# Patient Record
Sex: Female | Born: 1980 | Race: White | Hispanic: No | Marital: Married | Smoking: Never smoker
Health system: Southern US, Community
[De-identification: ages and names within clinical notes are randomized; demographics above are authoritative.]

## PROBLEM LIST (undated history)

## (undated) DIAGNOSIS — E039 Hypothyroidism, unspecified: Secondary | ICD-10-CM

## (undated) HISTORY — PX: TONSILLECTOMY: SUR1361

## (undated) HISTORY — PX: DILATION AND CURETTAGE OF UTERUS: SHX78

## (undated) HISTORY — PX: BREAST SURGERY: SHX581

## (undated) HISTORY — PX: ECTOPIC PREGNANCY SURGERY: SHX613

---

## 2010-08-13 ENCOUNTER — Encounter: Payer: Self-pay | Admitting: Emergency Medicine

## 2010-08-14 ENCOUNTER — Encounter (INDEPENDENT_AMBULATORY_CARE_PROVIDER_SITE_OTHER): Payer: Self-pay | Admitting: Obstetrics and Gynecology

## 2010-08-14 ENCOUNTER — Ambulatory Visit (HOSPITAL_COMMUNITY): Admission: AD | Admit: 2010-08-14 | Discharge: 2010-08-14 | Payer: Self-pay | Admitting: Obstetrics and Gynecology

## 2011-03-12 LAB — ABO/RH: ABO/RH(D): O POS

## 2011-03-12 LAB — HEMOGLOBIN AND HEMATOCRIT, BLOOD
HCT: 37.6 % (ref 36.0–46.0)
Hemoglobin: 12.8 g/dL (ref 12.0–15.0)

## 2011-03-12 LAB — CBC
MCHC: 33.6 g/dL (ref 30.0–36.0)
WBC: 10.6 10*3/uL — ABNORMAL HIGH (ref 4.0–10.5)

## 2011-03-12 LAB — HCG, QUANTITATIVE, PREGNANCY: hCG, Beta Chain, Quant, S: 1165 m[IU]/mL — ABNORMAL HIGH (ref <5)

## 2011-05-26 ENCOUNTER — Ambulatory Visit (HOSPITAL_COMMUNITY)
Admission: AD | Admit: 2011-05-26 | Discharge: 2011-05-26 | Disposition: A | Discharge status: Home / Self Care | Payer: Managed Care, Other (non HMO) | Source: Ambulatory Visit | Attending: Obstetrics and Gynecology | Admitting: Obstetrics and Gynecology

## 2011-05-26 ENCOUNTER — Ambulatory Visit (HOSPITAL_COMMUNITY): Payer: Managed Care, Other (non HMO)

## 2011-05-26 ENCOUNTER — Other Ambulatory Visit: Payer: Self-pay | Admitting: Obstetrics and Gynecology

## 2011-05-26 DIAGNOSIS — O021 Missed abortion: Secondary | ICD-10-CM | POA: Insufficient documentation

## 2011-05-26 LAB — COMPREHENSIVE METABOLIC PANEL
ALT: 12 U/L (ref 0–35)
AST: 17 U/L (ref 0–37)
Albumin: 4.6 g/dL (ref 3.5–5.2)
Calcium: 10 mg/dL (ref 8.4–10.5)
Creatinine, Ser: 0.61 mg/dL (ref 0.4–1.2)
GFR calc Af Amer: >60 mL/min (ref >60)
Sodium: 138 mEq/L (ref 135–145)
Total Protein: 7.9 g/dL (ref 6.0–8.3)

## 2011-05-26 LAB — CBC
HCT: 41 % (ref 36.0–46.0)
Hemoglobin: 14.3 g/dL (ref 12.0–15.0)
MCV: 87.4 fL (ref 78.0–100.0)
RDW: 13 % (ref 11.5–15.5)
WBC: 7.6 10*3/uL (ref 4.0–10.5)

## 2011-05-26 LAB — CREATININE, SERUM
Creatinine, Ser: 0.52 mg/dL (ref 0.4–1.2)
GFR calc Af Amer: >60 mL/min (ref >60)
GFR calc non Af Amer: >60 mL/min (ref >60)

## 2011-05-26 LAB — T3, FREE: T3, Free: 2.9 pg/mL (ref 2.3–4.2)

## 2011-05-26 LAB — DIFFERENTIAL
Basophils Relative: 0 % (ref 0–1)
Lymphocytes Relative: 21 % (ref 12–46)
Lymphs Abs: 1.6 10*3/uL (ref 0.7–4.0)
Monocytes Absolute: 0.5 10*3/uL (ref 0.1–1.0)
Monocytes Relative: 7 % (ref 3–12)
Neutro Abs: 5.4 10*3/uL (ref 1.7–7.7)

## 2011-05-29 ENCOUNTER — Inpatient Hospital Stay (HOSPITAL_COMMUNITY)
Admission: AD | Admit: 2011-05-29 | Discharge: 2011-05-29 | Disposition: A | Discharge status: Home / Self Care | Payer: Managed Care, Other (non HMO) | Source: Ambulatory Visit | Attending: Obstetrics and Gynecology | Admitting: Obstetrics and Gynecology

## 2011-05-29 DIAGNOSIS — O039 Complete or unspecified spontaneous abortion without complication: Secondary | ICD-10-CM

## 2011-05-29 LAB — HCG, QUANTITATIVE, PREGNANCY: hCG, Beta Chain, Quant, S: 8615 m[IU]/mL — ABNORMAL HIGH (ref <5)

## 2011-06-01 ENCOUNTER — Inpatient Hospital Stay (HOSPITAL_COMMUNITY)
Admission: AD | Admit: 2011-06-01 | Discharge: 2011-06-01 | Disposition: A | Discharge status: Home / Self Care | Payer: Managed Care, Other (non HMO) | Source: Ambulatory Visit | Attending: Obstetrics & Gynecology | Admitting: Obstetrics & Gynecology

## 2011-06-01 DIAGNOSIS — O00109 Unspecified tubal pregnancy without intrauterine pregnancy: Secondary | ICD-10-CM | POA: Insufficient documentation

## 2011-06-01 LAB — HCG, QUANTITATIVE, PREGNANCY: hCG, Beta Chain, Quant, S: 2846 m[IU]/mL — ABNORMAL HIGH (ref <5)

## 2011-06-15 ENCOUNTER — Inpatient Hospital Stay (HOSPITAL_COMMUNITY)
Admission: AD | Admit: 2011-06-15 | Discharge: 2011-06-15 | Disposition: A | Discharge status: Home / Self Care | Payer: Managed Care, Other (non HMO) | Source: Ambulatory Visit | Attending: Obstetrics and Gynecology | Admitting: Obstetrics and Gynecology

## 2011-06-15 DIAGNOSIS — O039 Complete or unspecified spontaneous abortion without complication: Secondary | ICD-10-CM | POA: Insufficient documentation

## 2011-06-15 DIAGNOSIS — E039 Hypothyroidism, unspecified: Secondary | ICD-10-CM | POA: Insufficient documentation

## 2011-06-15 DIAGNOSIS — E079 Disorder of thyroid, unspecified: Secondary | ICD-10-CM | POA: Insufficient documentation

## 2011-07-09 NOTE — Op Note (Signed)
  NAMEMERDIS, SNODGRASS NO.:  [id number]  MEDICAL RECORD NO.:  [id number]           PATIENT TYPE:  O  LOCATION:  [facility]                          [facility]  PHYSICIAN:  Carrington Clamp, M.D. DATE OF BIRTH:  September 05, 1981  DATE OF PROCEDURE:  05/26/2011 DATE OF DISCHARGE:                              OPERATIVE REPORT   PREOPERATIVE DIAGNOSIS:  Missed abortion.  POSTOPERATIVE DIAGNOSIS:  Missed abortion.  PROCEDURES:  Dilation and evacuation.  SURGEON:  Carrington Clamp, MD  ASSISTANTS:  None.  ANESTHESIA:  General.  FINDINGS:  About a 9-week size uterus down to 6-week good cri.  There was apparently tissue that was in the suction tubing, however, this had been sent for frozen section to make sure the patient does have products of conception in the uterus.  She does have a history of ectopic and her HCGs have been rising consistently.  SPECIMENS:  Uterine contents for pathology and frozen section.ESTIMATED BLOOD LOSS:  200 mL.  IV FLUIDS:  URINE OUTPUT:  Not measured.  COMPLICATIONS:  None.  Counts were correct x3.  MEDICATIONS:  Methergine IM.  TECHNIQUE:  After adequate general anesthesia was achieved, the patient was prepped and draped in usual sterile fashion in dorsal lithotomy position.  Bladder was emptied with a red rubber catheter.  A speculum was placed in vagina.  Single-tooth tenaculum was used to stabilize the cervix.  The cervix was dilated up with Shawnie Pons dilators.  The 9-mm suction curette was passed into the uterine cavity and although there was some brisk bleeding.  The suction curettage was performed until the contents were removed.  Good cri was noted with sharp curettage, bleeding actually slowed after that and the patient was given a dose of Methergine.  All instruments were withdrawn from the vagina and the patient returned to recovery room in stable condition.     Carrington Clamp, M.D.     MH/MEDQ  D:  05/26/2011   T:  05/27/2011  Job:  [id number]  Electronically Signed by Carrington Clamp MD on 07/09/2011 06:14:20 PM

## 2012-06-09 LAB — OB RESULTS CONSOLE HEPATITIS B SURFACE ANTIGEN: Hepatitis B Surface Ag: NEGATIVE

## 2012-06-09 LAB — OB RESULTS CONSOLE ABO/RH: RH Type: POSITIVE

## 2012-06-09 LAB — OB RESULTS CONSOLE RPR: RPR: NONREACTIVE

## 2012-06-09 LAB — OB RESULTS CONSOLE ANTIBODY SCREEN: Antibody Screen: NEGATIVE

## 2012-11-17 ENCOUNTER — Encounter (HOSPITAL_COMMUNITY): Payer: Self-pay

## 2012-11-17 ENCOUNTER — Inpatient Hospital Stay (HOSPITAL_COMMUNITY)
Admission: AD | Admit: 2012-11-17 | Discharge: 2012-11-17 | Disposition: A | Discharge status: Home / Self Care | Payer: BC Managed Care – PPO | Source: Ambulatory Visit | Attending: Obstetrics and Gynecology | Admitting: Obstetrics and Gynecology

## 2012-11-17 ENCOUNTER — Inpatient Hospital Stay (HOSPITAL_COMMUNITY): Payer: BC Managed Care – PPO

## 2012-11-17 DIAGNOSIS — O4690 Antepartum hemorrhage, unspecified, unspecified trimester: Secondary | ICD-10-CM

## 2012-11-17 DIAGNOSIS — O4410 Placenta previa with hemorrhage, unspecified trimester: Secondary | ICD-10-CM | POA: Insufficient documentation

## 2012-11-17 DIAGNOSIS — O4400 Placenta previa specified as without hemorrhage, unspecified trimester: Secondary | ICD-10-CM

## 2012-11-17 HISTORY — DX: Hypothyroidism, unspecified: E03.9

## 2012-11-17 MED ORDER — NIFEDIPINE 10 MG PO CAPS
20.0000 mg | ORAL_CAPSULE | Freq: Once | ORAL | Status: AC
  Administered 2012-11-17: 20 mg via ORAL
  Filled 2012-11-17: qty 2

## 2012-11-17 MED ORDER — NIFEDIPINE 10 MG PO CAPS: 10.0000 mg | ORAL_CAPSULE | Freq: Three times a day (TID) | ORAL | Status: DC | PRN

## 2012-11-17 NOTE — MAU Provider Note (Signed)
Chief Complaint:  Vaginal Bleeding   First Provider Initiated Contact with Patient 11/17/12 1941      HPI: Barby Colvard is a 31 y.o. G3P0020 at [redacted]w[redacted]d who presents to maternity admissions reporting first episode of bright red bleeding with known placenta previa. States she stood up at work and felt blood running down her leg, then wiped and saw red blood. She immediately drove here (2 hours distance). Had a couple of episodes of light spotting, last at 24 weeks. O pos. Denies contractions, leakage of fluid or vaginal bleeding. Good fetal movement.   Pregnancy Course:   Past Medical History: Past Medical History  Diagnosis Date  . Hypothyroidism     Past obstetric history: OB History    Grav Para Term Preterm Abortions TAB SAB Ect Mult Living   3    2  1 1        # Outc Date GA Lbr Len/2nd Wgt Sex Del Anes PTL Lv   1 SAB            2 ECT            3 CUR               Past Surgical History: Past Surgical History  Procedure Date  . Dilation and curettage of uterus   . Ectopic pregnancy surgery   . Tonsillectomy   . Breast surgery     Family History: History reviewed. No pertinent family history.  Social History: History  Substance Use Topics  . Smoking status: Never Smoker   . Smokeless tobacco: Not on file  . Alcohol Use: No    Allergies: No Known Allergies  Meds:  Prescriptions prior to admission  Medication Sig Dispense Refill  . lansoprazole (PREVACID) 15 MG capsule Take 15 mg by mouth daily.      Marland Kitchen levothyroxine (SYNTHROID, LEVOTHROID) 100 MCG tablet Take 100 mcg by mouth daily.      . Prenatal Vit-Fe Fumarate-FA (MULTIVITAMIN-PRENATAL) 27-0.8 MG TABS Take 1 tablet by mouth daily.        ROS: Pertinent findings in history of present illness.  Physical Exam  Blood pressure 127/72, pulse 101, temperature 98.2 F (36.8 C), temperature source Oral, resp. rate 16, height 5\' 11"  (1.803 m), weight 89.075 kg (196 lb 6 oz). GENERAL: Well-developed,  well-nourished female in no acute distress.  HEENT: normocephalic HEART: normal rate RESP: normal effort ABDOMEN: Soft, non-tender, gravid appropriate for gestational age EXTREMITIES: Nontender, no edema NEURO: alert and oriented Perineum: no blood, no peripad   FHT:  Baseline 135-140 , moderate variability, accelerations present, no decelerations Contractions: q 3-4 mins, 30 sec duration   Labs: No results found for this or any previous visit (from the past 24 hour(s)).  Imaging:  No results found.  MAU Course: 1955: C/W Dr. Henderson Cloud: will do speculum exam to evaluate for bleeding, [place] to check placenta, AFI, BPP  Assessment: No diagnosis found.  Plan:    Medication List     As of 11/17/2012  7:42 PM    ASK your doctor about these medications         lansoprazole 15 MG capsule   Commonly known as: PREVACID   Take 15 mg by mouth daily.      levothyroxine 100 MCG tablet   Commonly known as: SYNTHROID, LEVOTHROID   Take 100 mcg by mouth daily.      multivitamin-prenatal 27-0.8 MG Tabs   Take 1 tablet by mouth daily.  Danae Orleans, CNM 11/17/2012 7:42 PM  Care assumed by Wynelle Bourgeois, CNM at 2011  Spec Exam:  Small to mod. Brown/red mucous in vault with closed cervix  [place]:  BPP 8/8         Post Placenta Previa         No evidence of abruption        AFI normal  Discussed with Dr Henderson Cloud.  Will d/c home with PRN procardia Pelvic rest Advised out of work through weekend. Bleeding precautions

## 2012-11-17 NOTE — MAU Note (Signed)
Pt return from US  

## 2012-11-17 NOTE — MAU Note (Signed)
Pt states has placenta previa, has noted bleeding x3 hours, intermittent tightening in lower abdomen only.

## 2012-12-01 ENCOUNTER — Other Ambulatory Visit: Payer: Self-pay | Admitting: Obstetrics and Gynecology

## 2012-12-01 ENCOUNTER — Encounter (HOSPITAL_COMMUNITY): Payer: Self-pay | Admitting: *Deleted

## 2012-12-02 ENCOUNTER — Inpatient Hospital Stay (HOSPITAL_COMMUNITY)
Admission: RE | Admit: 2012-12-02 | Payer: BC Managed Care – PPO | Source: Ambulatory Visit | Admitting: Obstetrics and Gynecology

## 2012-12-02 ENCOUNTER — Encounter (HOSPITAL_COMMUNITY)
Admission: AD | Disposition: A | Discharge status: Home / Self Care | Payer: Self-pay | Source: Ambulatory Visit | Attending: Obstetrics and Gynecology

## 2012-12-02 ENCOUNTER — Encounter (HOSPITAL_COMMUNITY): Payer: Self-pay | Admitting: *Deleted

## 2012-12-02 ENCOUNTER — Encounter (HOSPITAL_COMMUNITY): Payer: Self-pay | Admitting: Anesthesiology

## 2012-12-02 ENCOUNTER — Inpatient Hospital Stay (HOSPITAL_COMMUNITY)
Admission: AD | Admit: 2012-12-02 | Discharge: 2012-12-05 | DRG: 651 | Disposition: A | Discharge status: Home / Self Care | Payer: BC Managed Care – PPO | Source: Ambulatory Visit | Attending: Obstetrics and Gynecology | Admitting: Obstetrics and Gynecology

## 2012-12-02 ENCOUNTER — Inpatient Hospital Stay (HOSPITAL_COMMUNITY): Payer: BC Managed Care – PPO | Admitting: Anesthesiology

## 2012-12-02 DIAGNOSIS — O4400 Placenta previa specified as without hemorrhage, unspecified trimester: Secondary | ICD-10-CM

## 2012-12-02 DIAGNOSIS — E039 Hypothyroidism, unspecified: Secondary | ICD-10-CM | POA: Diagnosis present

## 2012-12-02 DIAGNOSIS — E079 Disorder of thyroid, unspecified: Secondary | ICD-10-CM | POA: Diagnosis present

## 2012-12-02 DIAGNOSIS — O4410 Placenta previa with hemorrhage, unspecified trimester: Principal | ICD-10-CM | POA: Diagnosis present

## 2012-12-02 LAB — CBC
HCT: 36.9 % (ref 36.0–46.0)
Hemoglobin: 12.5 g/dL (ref 12.0–15.0)
MCH: 30.5 pg (ref 26.0–34.0)
MCV: 90 fL (ref 78.0–100.0)
Platelets: 182 10*3/uL (ref 150–400)
RBC: 4.1 MIL/uL (ref 3.87–5.11)
WBC: 9.2 10*3/uL (ref 4.0–10.5)

## 2012-12-02 LAB — PREPARE RBC (CROSSMATCH)

## 2012-12-02 SURGERY — Surgical Case
Anesthesia: Spinal

## 2012-12-02 SURGERY — Surgical Case
Anesthesia: Regional

## 2012-12-02 MED ORDER — PHENYLEPHRINE HCL 10 MG/ML IJ SOLN
INTRAMUSCULAR | Status: DC | PRN
  Administered 2012-12-02 (×8): 80 ug via INTRAVENOUS

## 2012-12-02 MED ORDER — ONDANSETRON HCL 4 MG PO TABS: 4.0000 mg | ORAL_TABLET | ORAL | Status: DC | PRN

## 2012-12-02 MED ORDER — KETOROLAC TROMETHAMINE 30 MG/ML IJ SOLN
30.0000 mg | Freq: Four times a day (QID) | INTRAMUSCULAR | Status: AC | PRN
  Administered 2012-12-02: 30 mg via INTRAVENOUS

## 2012-12-02 MED ORDER — ZOLPIDEM TARTRATE 5 MG PO TABS: 5.0000 mg | ORAL_TABLET | Freq: Every evening | ORAL | Status: DC | PRN

## 2012-12-02 MED ORDER — OXYTOCIN 10 UNIT/ML IJ SOLN
INTRAMUSCULAR | Status: AC
  Filled 2012-12-02: qty 4

## 2012-12-02 MED ORDER — LACTATED RINGERS IV SOLN
INTRAVENOUS | Status: DC
  Administered 2012-12-02 (×3): via INTRAVENOUS

## 2012-12-02 MED ORDER — FENTANYL CITRATE 0.05 MG/ML IJ SOLN: 25.0000 ug | INTRAMUSCULAR | Status: DC | PRN

## 2012-12-02 MED ORDER — WITCH HAZEL-GLYCERIN EX PADS: 1.0000 "application " | MEDICATED_PAD | CUTANEOUS | Status: DC | PRN

## 2012-12-02 MED ORDER — FENTANYL CITRATE 0.05 MG/ML IJ SOLN
INTRAMUSCULAR | Status: AC
  Filled 2012-12-02: qty 2

## 2012-12-02 MED ORDER — SCOPOLAMINE 1 MG/3DAYS TD PT72
1.0000 | MEDICATED_PATCH | Freq: Once | TRANSDERMAL | Status: AC
  Administered 2012-12-02: 1.5 mg via TRANSDERMAL

## 2012-12-02 MED ORDER — CITRIC ACID-SODIUM CITRATE 334-500 MG/5ML PO SOLN
30.0000 mL | Freq: Once | ORAL | Status: AC
  Administered 2012-12-02: 30 mL via ORAL
  Filled 2012-12-02: qty 15

## 2012-12-02 MED ORDER — MENTHOL 3 MG MT LOZG: 1.0000 | LOZENGE | OROMUCOSAL | Status: DC | PRN

## 2012-12-02 MED ORDER — CEFAZOLIN SODIUM-DEXTROSE 2-3 GM-% IV SOLR
2.0000 g | INTRAVENOUS | Status: AC
  Administered 2012-12-02: 2 g via INTRAVENOUS
  Filled 2012-12-02: qty 50

## 2012-12-02 MED ORDER — OXYTOCIN 40 UNITS IN LACTATED RINGERS INFUSION - SIMPLE MED
62.5000 mL/h | INTRAVENOUS | Status: AC
  Administered 2012-12-02: 62.5 mL/h via INTRAVENOUS
  Filled 2012-12-02: qty 1000

## 2012-12-02 MED ORDER — LANOLIN HYDROUS EX OINT: 1.0000 "application " | TOPICAL_OINTMENT | CUTANEOUS | Status: DC | PRN

## 2012-12-02 MED ORDER — MORPHINE SULFATE (PF) 0.5 MG/ML IJ SOLN
INTRAMUSCULAR | Status: DC | PRN
  Administered 2012-12-02: .15 mg via INTRATHECAL

## 2012-12-02 MED ORDER — BUPIVACAINE IN DEXTROSE 0.75-8.25 % IT SOLN
INTRATHECAL | Status: DC | PRN
  Administered 2012-12-02: 2 mL via INTRATHECAL

## 2012-12-02 MED ORDER — DIBUCAINE 1 % RE OINT: 1.0000 "application " | TOPICAL_OINTMENT | RECTAL | Status: DC | PRN

## 2012-12-02 MED ORDER — DIPHENHYDRAMINE HCL 25 MG PO CAPS: 25.0000 mg | ORAL_CAPSULE | Freq: Four times a day (QID) | ORAL | Status: DC | PRN

## 2012-12-02 MED ORDER — ONDANSETRON HCL 4 MG/2ML IJ SOLN
INTRAMUSCULAR | Status: DC | PRN
  Administered 2012-12-02: 4 mg via INTRAVENOUS

## 2012-12-02 MED ORDER — DIPHENHYDRAMINE HCL 50 MG/ML IJ SOLN: 25.0000 mg | INTRAMUSCULAR | Status: DC | PRN

## 2012-12-02 MED ORDER — TETANUS-DIPHTH-ACELL PERTUSSIS 5-2.5-18.5 LF-MCG/0.5 IM SUSP
0.5000 mL | Freq: Once | INTRAMUSCULAR | Status: AC
  Administered 2012-12-04: 0.5 mL via INTRAMUSCULAR
  Filled 2012-12-02: qty 0.5

## 2012-12-02 MED ORDER — SIMETHICONE 80 MG PO CHEW
80.0000 mg | CHEWABLE_TABLET | Freq: Three times a day (TID) | ORAL | Status: DC
  Administered 2012-12-02 – 2012-12-05 (×7): 80 mg via ORAL

## 2012-12-02 MED ORDER — NALOXONE HCL 0.4 MG/ML IJ SOLN: 0.4000 mg | INTRAMUSCULAR | Status: DC | PRN

## 2012-12-02 MED ORDER — SODIUM CHLORIDE 0.9 % IJ SOLN: 3.0000 mL | INTRAMUSCULAR | Status: DC | PRN

## 2012-12-02 MED ORDER — IBUPROFEN 600 MG PO TABS
600.0000 mg | ORAL_TABLET | Freq: Four times a day (QID) | ORAL | Status: DC | PRN
  Filled 2012-12-02 (×9): qty 1

## 2012-12-02 MED ORDER — LACTATED RINGERS IV SOLN
INTRAVENOUS | Status: DC
  Administered 2012-12-02: 125 mL/h via INTRAVENOUS
  Administered 2012-12-03: 06:00:00 via INTRAVENOUS

## 2012-12-02 MED ORDER — FENTANYL CITRATE 0.05 MG/ML IJ SOLN
INTRAMUSCULAR | Status: DC | PRN
  Administered 2012-12-02: 25 ug via INTRATHECAL

## 2012-12-02 MED ORDER — DIPHENHYDRAMINE HCL 25 MG PO CAPS: 25.0000 mg | ORAL_CAPSULE | ORAL | Status: DC | PRN

## 2012-12-02 MED ORDER — MORPHINE SULFATE 0.5 MG/ML IJ SOLN
INTRAMUSCULAR | Status: AC
  Filled 2012-12-02: qty 10

## 2012-12-02 MED ORDER — PRENATAL MULTIVITAMIN CH
1.0000 | ORAL_TABLET | Freq: Every day | ORAL | Status: DC
  Administered 2012-12-03 – 2012-12-05 (×3): 1 via ORAL
  Filled 2012-12-02 (×3): qty 1

## 2012-12-02 MED ORDER — NALBUPHINE HCL 10 MG/ML IJ SOLN
5.0000 mg | INTRAMUSCULAR | Status: DC | PRN
  Filled 2012-12-02: qty 1

## 2012-12-02 MED ORDER — DIPHENHYDRAMINE HCL 50 MG/ML IJ SOLN
12.5000 mg | INTRAMUSCULAR | Status: DC | PRN
  Administered 2012-12-02: 12.5 mg via INTRAVENOUS
  Filled 2012-12-02: qty 1

## 2012-12-02 MED ORDER — PHENYLEPHRINE 40 MCG/ML (10ML) SYRINGE FOR IV PUSH (FOR BLOOD PRESSURE SUPPORT)
PREFILLED_SYRINGE | INTRAVENOUS | Status: AC
  Filled 2012-12-02: qty 20

## 2012-12-02 MED ORDER — KETOROLAC TROMETHAMINE 30 MG/ML IJ SOLN: 30.0000 mg | Freq: Four times a day (QID) | INTRAMUSCULAR | Status: AC | PRN

## 2012-12-02 MED ORDER — MEPERIDINE HCL 25 MG/ML IJ SOLN: 6.2500 mg | INTRAMUSCULAR | Status: DC | PRN

## 2012-12-02 MED ORDER — SIMETHICONE 80 MG PO CHEW: 80.0000 mg | CHEWABLE_TABLET | ORAL | Status: DC | PRN

## 2012-12-02 MED ORDER — ONDANSETRON HCL 4 MG/2ML IJ SOLN: 4.0000 mg | INTRAMUSCULAR | Status: DC | PRN

## 2012-12-02 MED ORDER — METOCLOPRAMIDE HCL 5 MG/ML IJ SOLN: 10.0000 mg | Freq: Three times a day (TID) | INTRAMUSCULAR | Status: DC | PRN

## 2012-12-02 MED ORDER — LEVOTHYROXINE SODIUM 100 MCG PO TABS
100.0000 ug | ORAL_TABLET | Freq: Every day | ORAL | Status: DC
  Administered 2012-12-03 – 2012-12-05 (×3): 100 ug via ORAL
  Filled 2012-12-02 (×3): qty 1

## 2012-12-02 MED ORDER — NALOXONE HCL 1 MG/ML IJ SOLN: 1.0000 ug/kg/h | INTRAVENOUS | Status: DC | PRN

## 2012-12-02 MED ORDER — OXYCODONE-ACETAMINOPHEN 5-325 MG PO TABS
1.0000 | ORAL_TABLET | ORAL | Status: DC | PRN
  Administered 2012-12-03: 1 via ORAL
  Administered 2012-12-03: 2 via ORAL
  Administered 2012-12-03: 1 via ORAL
  Administered 2012-12-03 – 2012-12-05 (×7): 2 via ORAL
  Filled 2012-12-02 (×5): qty 2
  Filled 2012-12-02 (×3): qty 1
  Filled 2012-12-02 (×3): qty 2
  Filled 2012-12-02: qty 1

## 2012-12-02 MED ORDER — METHYLERGONOVINE MALEATE 0.2 MG/ML IJ SOLN: 0.2000 mg | INTRAMUSCULAR | Status: DC | PRN

## 2012-12-02 MED ORDER — OXYTOCIN 10 UNIT/ML IJ SOLN
40.0000 [IU] | INTRAVENOUS | Status: DC | PRN
  Administered 2012-12-02: 40 [IU] via INTRAVENOUS

## 2012-12-02 MED ORDER — SENNOSIDES-DOCUSATE SODIUM 8.6-50 MG PO TABS
2.0000 | ORAL_TABLET | Freq: Every day | ORAL | Status: DC
  Administered 2012-12-02 – 2012-12-04 (×3): 2 via ORAL

## 2012-12-02 MED ORDER — ONDANSETRON HCL 4 MG/2ML IJ SOLN
INTRAMUSCULAR | Status: AC
  Filled 2012-12-02: qty 2

## 2012-12-02 MED ORDER — ONDANSETRON HCL 4 MG/2ML IJ SOLN: 4.0000 mg | Freq: Three times a day (TID) | INTRAMUSCULAR | Status: DC | PRN

## 2012-12-02 MED ORDER — SCOPOLAMINE 1 MG/3DAYS TD PT72
MEDICATED_PATCH | TRANSDERMAL | Status: AC
  Administered 2012-12-02: 1.5 mg via TRANSDERMAL
  Filled 2012-12-02: qty 1

## 2012-12-02 MED ORDER — LEVOTHYROXINE SODIUM 100 MCG PO TABS
100.0000 ug | ORAL_TABLET | Freq: Every day | ORAL | Status: DC
  Filled 2012-12-02 (×2): qty 1

## 2012-12-02 MED ORDER — IBUPROFEN 600 MG PO TABS
600.0000 mg | ORAL_TABLET | Freq: Four times a day (QID) | ORAL | Status: DC
  Administered 2012-12-02 – 2012-12-05 (×11): 600 mg via ORAL
  Filled 2012-12-02: qty 1

## 2012-12-02 MED ORDER — METHYLERGONOVINE MALEATE 0.2 MG PO TABS: 0.2000 mg | ORAL_TABLET | ORAL | Status: DC | PRN

## 2012-12-02 MED ORDER — KETOROLAC TROMETHAMINE 30 MG/ML IJ SOLN
INTRAMUSCULAR | Status: AC
  Administered 2012-12-02: 30 mg via INTRAVENOUS
  Filled 2012-12-02: qty 1

## 2012-12-02 SURGICAL SUPPLY — 31 items
CLOTH BEACON ORANGE TIMEOUT ST (SAFETY) ×2 IMPLANT
DERMABOND ADVANCED (GAUZE/BANDAGES/DRESSINGS) ×2
DERMABOND ADVANCED .7 DNX12 (GAUZE/BANDAGES/DRESSINGS) ×2 IMPLANT
DRESSING TELFA 8X3 (GAUZE/BANDAGES/DRESSINGS) ×2 IMPLANT
DRSG COVADERM 4X10 (GAUZE/BANDAGES/DRESSINGS) ×2 IMPLANT
DRSG OPSITE POSTOP 4X10 (GAUZE/BANDAGES/DRESSINGS) IMPLANT
DURAPREP 26ML APPLICATOR (WOUND CARE) ×2 IMPLANT
ELECT REM PT RETURN 9FT ADLT (ELECTROSURGICAL) ×2
ELECTRODE REM PT RTRN 9FT ADLT (ELECTROSURGICAL) ×1 IMPLANT
EXTRACTOR VACUUM M CUP 4 TUBE (SUCTIONS) IMPLANT
GAUZE SPONGE 4X4 12PLY STRL LF (GAUZE/BANDAGES/DRESSINGS) ×4 IMPLANT
GLOVE BIO SURGEON STRL SZ7 (GLOVE) ×4 IMPLANT
GLOVE SURG SS PI 7.5 STRL IVOR (GLOVE) ×4 IMPLANT
GOWN PREVENTION PLUS LG XLONG (DISPOSABLE) ×4 IMPLANT
KIT ABG SYR 3ML LUER SLIP (SYRINGE) IMPLANT
NEEDLE HYPO 25X5/8 SAFETYGLIDE (NEEDLE) IMPLANT
NS IRRIG 1000ML POUR BTL (IV SOLUTION) ×2 IMPLANT
PACK C SECTION WH (CUSTOM PROCEDURE TRAY) ×2 IMPLANT
PAD ABD 7.5X8 STRL (GAUZE/BANDAGES/DRESSINGS) ×2 IMPLANT
PAD OB MATERNITY 4.3X12.25 (PERSONAL CARE ITEMS) ×2 IMPLANT
RTRCTR C-SECT PINK 25CM LRG (MISCELLANEOUS) ×2 IMPLANT
SLEEVE SCD COMPRESS KNEE MED (MISCELLANEOUS) ×2 IMPLANT
STAPLER VISISTAT 35W (STAPLE) IMPLANT
SUT CHROMIC 1 CTX 36 (SUTURE) ×6 IMPLANT
SUT PDS AB 0 CTX 60 (SUTURE) ×2 IMPLANT
SUT VIC AB 2-0 CT1 27 (SUTURE) ×1
SUT VIC AB 2-0 CT1 TAPERPNT 27 (SUTURE) ×1 IMPLANT
SUT VIC AB 4-0 KS 27 (SUTURE) ×2 IMPLANT
TOWEL OR 17X24 6PK STRL BLUE (TOWEL DISPOSABLE) ×4 IMPLANT
TRAY FOLEY CATH 14FR (SET/KITS/TRAYS/PACK) ×2 IMPLANT
WATER STERILE IRR 1000ML POUR (IV SOLUTION) ×2 IMPLANT

## 2012-12-02 NOTE — H&P (Signed)
Candice Barr is a 31 y.o. female presenting for bleeding  31 yo G3P0020 @ 36+6 with a known placenta previa presents with vaginal bleeding.  She woke of this morning and had a gush of blood.  Since that time she has had a light trickle of bleeding.  She has also started to experience so contractions.  Given the known previa and now new onset of bleeding the decision was made to proceed with cesarean section.  R/B/A reviewed with the patient.  History OB History    Grav Para Term Preterm Abortions TAB SAB Ect Mult Living   3    2  1 1        Past Medical History  Diagnosis Date  . Hypothyroidism    Past Surgical History  Procedure Date  . Dilation and curettage of uterus   . Ectopic pregnancy surgery   . Tonsillectomy   . Breast surgery    Family History: family history is not on file. Social History:  reports that she has never smoked. She does not have any smokeless tobacco history on file. She reports that she does not drink alcohol or use illicit drugs.   Prenatal Transfer Tool  Maternal Diabetes: No Genetic Screening: Declined Maternal Ultrasounds/Referrals: Abnormal:  Findings:   Other: Placenta Previa Fetal Ultrasounds or other Referrals:  None Maternal Substance Abuse:  No Significant Maternal Medications:  Meds include: Syntroid Significant Maternal Lab Results:  None Other Comments:  None  ROS: as above    Blood pressure 122/68, pulse 85, temperature 97.9 F (36.6 C), temperature source Oral, resp. rate 18, height 5\' 11"  (1.803 m), weight 89.359 kg (197 lb). Exam Physical Exam  Prenatal labs: ABO, Rh: O/Positive/-- (06/14 0000) Antibody: Negative (06/14 0000) Rubella: Immune (06/14 0000) RPR: Nonreactive (06/14 0000)  HBsAg: Negative (06/14 0000)  HIV: Non-reactive (06/14 0000)  GBS:     Assessment/Plan: 1) admit 2) T&C 2 units 3) Proceed with Primary c/s.  R/B/A reviewed 4) SCDs to OR. Ancef  Eero Dini H. 12/02/2012, 8:40 AM

## 2012-12-02 NOTE — Anesthesia Procedure Notes (Signed)
Spinal  Patient location during procedure: OR Start time: 12/02/2012 10:28 AM Staffing Anesthesiologist: Keyanni Whittinghill A. Performed by: anesthesiologist  Preanesthetic Checklist Completed: patient identified, site marked, surgical consent, pre-op evaluation, timeout performed, IV checked, risks and benefits discussed and monitors and equipment checked Spinal Block Patient position: sitting Prep: site prepped and draped and DuraPrep Patient monitoring: heart rate, cardiac monitor, continuous pulse ox and blood pressure Approach: midline Location: L3-4 Injection technique: single-shot Needle Needle type: Sprotte  Needle gauge: 24 G Needle length: 9 cm Needle insertion depth: 5 cm Assessment Sensory level: T4 Additional Notes Patient tolerated procedure well. Adequate sensory level.

## 2012-12-02 NOTE — Op Note (Signed)
Pre-Operative Diagnosis: 1) 30+6 week intrauterine pregnancy 2) Known placenta previa 3) Bleeding Postoperative Diagnosis: Same Procedure: Primary low transverse cesarean section via pfannenstiel skin incision Surgeon: Dr. Waynard Reeds Assistant: None Operative Findings: Vigorous female infant in the vertex presentation. Apgars 7 & 7. Weight Pending. Infant remained with mother in OR.  Placenta previa. Normal ovaries and tubes. Specimen: Placenta for disposal EBL: Total I/O In: 2500 [I.V.:2500] Out: 1100 [Urine:200; Blood:900]   Procedure:Ms. Isley is an 30 year old gravida 3 para 0020 at 51 weeks and 6 days estimated gestational age who presents for cesarean section. The patient has a known placenta previa and was scheduled for cesarean next week.  This morning she awoke to a gush of blood. Following the appropriate informed consent the patient was brought to the operating room where spinal anesthesia was administered and found to be adequate. She was placed in the dorsal supine position with a leftward tilt. She was prepped and draped in the normal sterile fashion. Scalpel was then used to make a Pfannenstiel skin incision which was carried down to the underlying layers of soft tissue to the fascia. The fascia was incised in the midline and the fascial incision was extended laterally with Mayo scissors. The superior aspect of the fascial incision was grasped with Coker clamps x2, tented up and the rectus muscles dissected off sharply with the electrocautery unit area and the same procedure was repeated on the inferior aspect of the fascial incision. The rectus muscles were separated in the midline. The abdominal peritoneum was identified, tented up, entered sharply, and the incision was extended superiorly and inferiorly with good visualization of the bladder. The Alexis retractor was then deployed. The vesicouterine peritoneum was identified, tented up, entered sharply, and the bladder flap was created  digitally. Scalpel was then used to make a low transverse incision on the uterus which was extended laterally with blunt dissection. The fetal vertex was identified, The mushroom vacuum was applied to assist with delivery of the infants head. The head was delivered through the uterine incision followed by the body. The infant was bulb suctioned on the operative field cried vigorously, cord was clamped and cut and the infant was passed to the waiting neonatologist. Placenta was then delivered spontaneously, the uterus was cleared of all clot and debris. The uterine incision was repaired with #1 chromic in running locked fashion followed by a second imbricating layer. Ovaries and tubes were inspected and normal. The Alexis retractor was removed. The uterus was returned to the abdominal cavity the abdominal cavity was cleared of all clot and debris. The abdominal peritoneum was reapproximated with 2-0 Vicryl in a running fashion, the rectus muscles was reapproximated with #1 chromic in a running fashion. The fascia was closed with a looped PDS in a running fashion. The skin was closed with 4-0 vicryl in a subcuticular fashion and Dermabond. All sponge lap and needle counts were correct x2. Patient tolerated the procedure well and recovered in stable condition following the procedure.

## 2012-12-02 NOTE — Transfer of Care (Signed)
Immediate Anesthesia Transfer of Care Note  Patient: Candice Barr  Procedure(s) Performed: Procedure(s) (LRB) with comments: CESAREAN SECTION (N/A)  Patient Location: PACU  Anesthesia Type:Spinal  Level of Consciousness: awake, alert  and oriented  Airway & Oxygen Therapy: Patient Spontanous Breathing  Post-op Assessment: Report given to PACU RN and Post -op Vital signs reviewed and stable  Post vital signs: stable  Complications: No apparent anesthesia complications

## 2012-12-02 NOTE — MAU Provider Note (Signed)
  History     CSN: [id number]  Arrival date and time: 12/02/12 4098   First Provider Initiated Contact with Patient 12/02/12 [id number]      Chief Complaint  Patient presents with  . Vaginal Bleeding   HPI Candice Barr 31 y.o. [redacted]w[redacted]d  Client has a known placenta previa and is scheduled for C/S on Tuesday.  Felt a gush of blood at 2 am and has continued to have some bleeding since then.  Is not having pain.  OB History    Grav Para Term Preterm Abortions TAB SAB Ect Mult Living   3    2  1 1         Past Medical History  Diagnosis Date  . Hypothyroidism     Past Surgical History  Procedure Date  . Dilation and curettage of uterus   . Ectopic pregnancy surgery   . Tonsillectomy   . Breast surgery     History reviewed. No pertinent family history.  History  Substance Use Topics  . Smoking status: Never Smoker   . Smokeless tobacco: Not on file  . Alcohol Use: No    Allergies: No Known Allergies  Prescriptions prior to admission  Medication Sig Dispense Refill  . lansoprazole (PREVACID) 15 MG capsule Take 15 mg by mouth daily.      Marland Kitchen levothyroxine (SYNTHROID, LEVOTHROID) 100 MCG tablet Take 100 mcg by mouth daily.      Marland Kitchen NIFEdipine (PROCARDIA) 10 MG capsule Take 1 capsule (10 mg total) by mouth 3 (three) times daily as needed (Contractions).  20 capsule  1  . Prenatal Vit-Fe Fumarate-FA (PRENATAL MULTIVITAMIN) TABS Take 1 tablet by mouth every morning.        Review of Systems  Gastrointestinal: Negative for nausea, vomiting and abdominal pain.  Genitourinary:       Vaginal bleeding  Musculoskeletal: Negative for back pain.   Physical Exam   Blood pressure 122/68, pulse 85, temperature 97.9 F (36.6 C), temperature source Oral, resp. rate 18, height 5\' 11"  (1.803 m), weight 89.359 kg (197 lb).  Physical Exam  Nursing note and vitals reviewed. Constitutional: She is oriented to person, place, and time. She appears well-developed and well-nourished.  HENT:   Head: Normocephalic.  Eyes: EOM are normal.  Neck: Neck supple.  GI: Soft. There is no tenderness.       Contracting every 2 minutes FHT reactive.  Genitourinary:       Speculum exam - small amount of liquid dark blood in vagina.  What appears to be a clot seen deep in the vagina - suspect it is covering the cervix.  Bimanual deferred.  Musculoskeletal: Normal range of motion.  Neurological: She is alert and oriented to person, place, and time.  Skin: Skin is warm and dry.  Psychiatric: She has a normal mood and affect.    MAU Course  Procedures  MDM [phone] Consult with Dr. Tenny Craw.  Will type and crossmatch for 2 units of RBCs.  Will plan C/S today.  Assessment and Plan  Placenta previa with bleeding at 36w 6d  Plan Delivery today.  Candice Barr 12/02/2012, 7:58 AM

## 2012-12-02 NOTE — MAU Note (Signed)
Big gush of vaginal bleeding around 2am. Started to slow down however has seen increased. Complete previa schedule for a c/s on Tuesday.l

## 2012-12-02 NOTE — Anesthesia Preprocedure Evaluation (Signed)
Anesthesia Evaluation  Patient identified by MRN, date of birth, ID band Patient awake    Reviewed: Allergy & Precautions, H&P , Patient's Chart, lab work & pertinent test results  Airway Mallampati: II TM Distance: >3 FB Neck ROM: Full    Dental No notable dental hx. (+) Teeth Intact   Pulmonary neg pulmonary ROS,  breath sounds clear to auscultation  Pulmonary exam normal       Cardiovascular negative cardio ROS  Rhythm:Regular Rate:Normal     Neuro/Psych negative neurological ROS  negative psych ROS   GI/Hepatic negative GI ROS, Neg liver ROS,   Endo/Other  Hypothyroidism   Renal/GU negative Renal ROS  negative genitourinary   Musculoskeletal   Abdominal Normal abdominal exam  (+)   Peds  Hematology negative hematology ROS (+)   Anesthesia Other Findings   Reproductive/Obstetrics (+) Pregnancy Complete Placenta Previa- some bleeding this am                           Anesthesia Physical Anesthesia Plan  ASA: II and emergent  Anesthesia Plan: Spinal   Post-op Pain Management:    Induction:   Airway Management Planned:   Additional Equipment:   Intra-op Plan:   Post-operative Plan:   Informed Consent: I have reviewed the patients History and Physical, chart, labs and discussed the procedure including the risks, benefits and alternatives for the proposed anesthesia with the patient or authorized representative who has indicated his/her understanding and acceptance.   Dental Advisory Given  Plan Discussed with: Anesthesiologist, CRNA and Surgeon  Anesthesia Plan Comments:         Anesthesia Quick Evaluation

## 2012-12-02 NOTE — MAU Note (Signed)
Pt states she has a previa, and noticed a gush of bleeding this am at 2 and it has leveled off. Couple of spots noted on peri pad at this time

## 2012-12-02 NOTE — Anesthesia Postprocedure Evaluation (Signed)
  Anesthesia Post-op Note  Patient: Candice Barr  Procedure(s) Performed: Procedure(s) (LRB) with comments: CESAREAN SECTION (N/A)  Patient Location: PACU  Anesthesia Type:Spinal  Level of Consciousness: awake, alert  and oriented  Airway and Oxygen Therapy: Patient Spontanous Breathing  Post-op Pain: none  Post-op Assessment: Post-op Vital signs reviewed, Patient's Cardiovascular Status Stable, Respiratory Function Stable, Patent Airway, No signs of Nausea or vomiting, Pain level controlled, No headache, No backache, No residual numbness and No residual motor weakness  Post-op Vital Signs: Reviewed and stable  Complications: No apparent anesthesia complications

## 2012-12-03 LAB — CBC
Hemoglobin: 9.8 g/dL — ABNORMAL LOW (ref 12.0–15.0)
MCHC: 33.1 g/dL (ref 30.0–36.0)
RDW: 13.2 % (ref 11.5–15.5)

## 2012-12-03 NOTE — Progress Notes (Signed)
Subjective: Postpartum Day 1: Cesarean Delivery Patient reports pain well controlled. No nausea or vomiting.  Bleeding appropriate  Objective: Vital signs in last 24 hours: Temp:  [97.5 F (36.4 C)-98.7 F (37.1 C)] 97.6 F (36.4 C) (12/08 0948) Pulse Rate:  [59-97] 78  (12/08 0948) Resp:  [12-21] 18  (12/08 0948) BP: (91-125)/(46-76) 105/54 mmHg (12/08 0948) SpO2:  [97 %-100 %] 97 % (12/08 0948) Weight:  [89.359 kg (197 lb)] 89.359 kg (197 lb) (12/07 1400)  Physical Exam:  General: alert, cooperative and appears stated age Lochia: appropriate Uterine Fundus: firm Incision: healing well DVT Evaluation: No evidence of DVT seen on physical exam.   Basename 12/03/12 0521 12/02/12 0800  HGB 9.8* 12.5  HCT 29.6* 36.9    Assessment/Plan: Status post Cesarean section. Doing well postoperatively.  Continue current care.  Towana Stenglein H. 12/03/2012, 10:29 AM

## 2012-12-03 NOTE — Progress Notes (Addendum)
CSW met with pt & spouse briefly to assess situation and offer resources if needed. Pt and spouse appear to be doing well emotionally and possess a good understanding of infants NICU admission. They have plenty of family support and appear to be appropriate. They have majority of supplies for infant and resources to obtain more. Pediatrician has not been selected at this time for baby Maddox Capron. They have reliable transportation to come visit with infant once discharged. No social issues are present at this time. CSW will continue to follow and assist as needed.      

## 2012-12-04 ENCOUNTER — Encounter (HOSPITAL_COMMUNITY): Payer: Self-pay | Admitting: *Deleted

## 2012-12-04 ENCOUNTER — Inpatient Hospital Stay (HOSPITAL_COMMUNITY): Admission: RE | Admit: 2012-12-04 | Payer: BC Managed Care – PPO | Source: Ambulatory Visit

## 2012-12-04 NOTE — Plan of Care (Signed)
Problem: Discharge Progression Outcomes Goal: Barriers To Progression Addressed/Resolved Outcome: Completed/Met Date Met:  12/04/12 Has voided large amts since Foley discontinued Good pain control on po Percocet Goal: Activity appropriate for discharge plan Outcome: Completed/Met Date Met:  12/04/12 Tolerates her walks well Goal: Tolerating diet Outcome: Completed/Met Date Met:  12/04/12 Tolerating a regular diet well Goal: Pain controlled with appropriate interventions Outcome: Completed/Met Date Met:  12/04/12 Good pain control with Motrin and Percocet Goal: Discharge plan in place and appropriate Outcome: Completed/Met Date Met:  12/04/12 Pain controlled VSS Understands self care Understands when to call MD F/U care

## 2012-12-04 NOTE — Progress Notes (Signed)
Post Op Day 2 Subjective: no complaints  Objective: Blood pressure 112/66, pulse 80, temperature 98.5 F (36.9 C), temperature source Oral, resp. rate 16, height 5\' 11"  (1.803 m), weight 197 lb (89.359 kg), SpO2 99.00%, unknown if currently breastfeeding.  Physical Exam:  General: alert Lochia: appropriate Uterine Fundus: firm Incision: healing well   Basename 12/03/12 0521 12/02/12 0800  HGB 9.8* 12.5  HCT 29.6* 36.9    Assessment/Plan: Plan for discharge tomorrow, Baby in NICU with RDS.   LOS: 2 days   Candice Barr 12/04/2012, 11:15 AM

## 2012-12-04 NOTE — Plan of Care (Signed)
Problem: Discharge Progression Outcomes Goal: Remove staples per MD order Outcome: Not Applicable Date Met:  12/04/12 Patient incision has Dermabond

## 2012-12-05 ENCOUNTER — Encounter (HOSPITAL_COMMUNITY)
Admission: RE | Admit: 2012-12-05 | Discharge: 2012-12-05 | Disposition: A | Discharge status: Home / Self Care | Payer: BC Managed Care – PPO | Source: Ambulatory Visit | Attending: Obstetrics and Gynecology | Admitting: Obstetrics and Gynecology

## 2012-12-05 DIAGNOSIS — O923 Agalactia: Secondary | ICD-10-CM | POA: Insufficient documentation

## 2012-12-05 MED ORDER — OXYCODONE-ACETAMINOPHEN 5-325 MG PO TABS: 1.0000 | ORAL_TABLET | ORAL | Status: AC | PRN

## 2012-12-05 MED ORDER — IBUPROFEN 600 MG PO TABS: 600.0000 mg | ORAL_TABLET | Freq: Four times a day (QID) | ORAL | Status: AC | PRN

## 2012-12-05 NOTE — Progress Notes (Signed)
Discharge instructions reviewed with patient and husband.  Both state understanding of home care and signs/symptoms to report to MD.  No home equipment needed.  Ambulated to car with staff without incident and discharged home with husband.

## 2012-12-05 NOTE — Discharge Summary (Signed)
Obstetric Discharge Summary Reason for Admission: known placenta previa with bleeding Prenatal Procedures: none Intrapartum Procedures: cesarean: low cervical, transverse Postpartum Procedures: none Complications-Operative and Postpartum: none Hemoglobin  Date Value Range Status  12/03/2012 9.8* 12.0 - 15.0 g/dL Final     DELTA CHECK NOTED     REPEATED TO VERIFY     HCT  Date Value Range Status  12/03/2012 29.6* 36.0 - 46.0 % Final    Physical Exam:  General: alert and cooperative Lochia: appropriate Uterine Fundus: firm Incision: healing well DVT Evaluation: No evidence of DVT seen on physical exam.  Discharge Diagnoses: Term Pregnancy-delivered  Discharge Information: Date: 12/05/2012 Activity: pelvic rest Diet: routine Medications: PNV, Ibuprofen, Colace and Percocet Condition: stable Instructions: refer to practice specific booklet Discharge to: home Follow-up Information    Follow up with Almon Hercules., MD. In 2 weeks.   Contact information:   [address] [place] [address] [phone]          Newborn Data: Live born female  Birth Weight: 5 lb 15.2 oz (2700 g) APGAR: 8, 8  Baby to remain in NICU for treatment of RDS.  Philip Aspen 12/05/2012, 9:41 AM

## 2012-12-06 LAB — TYPE AND SCREEN
ABO/RH(D): O POS
Antibody Screen: NEGATIVE
Unit division: 0

## 2013-01-05 ENCOUNTER — Encounter (HOSPITAL_COMMUNITY)
Admission: RE | Admit: 2013-01-05 | Discharge: 2013-01-05 | Disposition: A | Discharge status: Home / Self Care | Payer: BC Managed Care – PPO | Source: Ambulatory Visit | Attending: Obstetrics and Gynecology | Admitting: Obstetrics and Gynecology

## 2013-01-05 DIAGNOSIS — O923 Agalactia: Secondary | ICD-10-CM | POA: Insufficient documentation

## 2013-04-04 ENCOUNTER — Other Ambulatory Visit: Payer: Self-pay | Admitting: Obstetrics and Gynecology

## 2013-07-31 ENCOUNTER — Other Ambulatory Visit: Payer: Self-pay | Admitting: Internal Medicine

## 2013-07-31 DIAGNOSIS — E042 Nontoxic multinodular goiter: Secondary | ICD-10-CM

## 2013-11-08 ENCOUNTER — Ambulatory Visit
Admission: RE | Admit: 2013-11-08 | Discharge: 2013-11-08 | Disposition: A | Discharge status: Home / Self Care | Payer: BC Managed Care – PPO | Source: Ambulatory Visit | Attending: Internal Medicine | Admitting: Internal Medicine

## 2013-11-08 DIAGNOSIS — E042 Nontoxic multinodular goiter: Secondary | ICD-10-CM

## 2014-09-02 IMAGING — US US FETAL BPP W/O NONSTRESS
1 series · 14 of 28 positions shown · non-contrast
Comparison: none

CLINICAL DATA: Bleeding with known placental previa.

[Series 1: us fetal bpp w/o nonstress · non-contrast · 37 acquisitions, 14 frames shown]
[im 2/37]
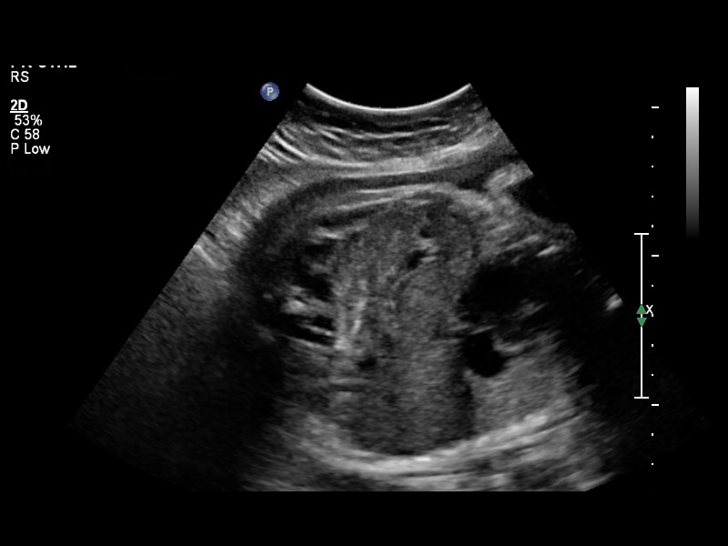
[im 5/37]
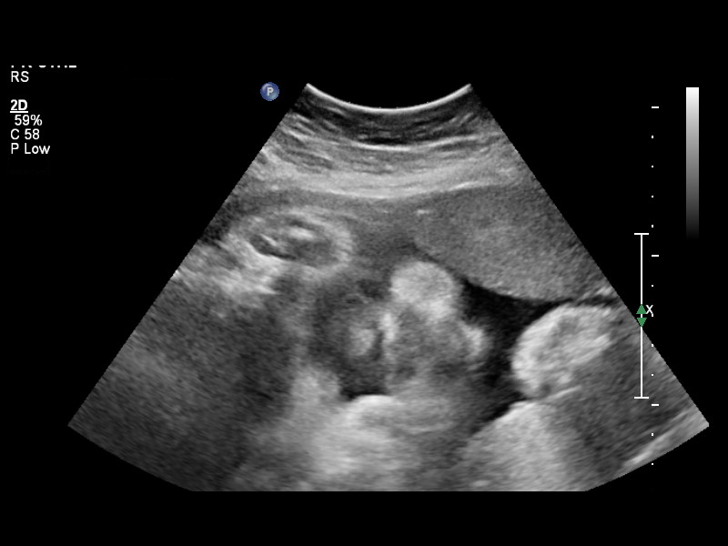
[im 7/37]
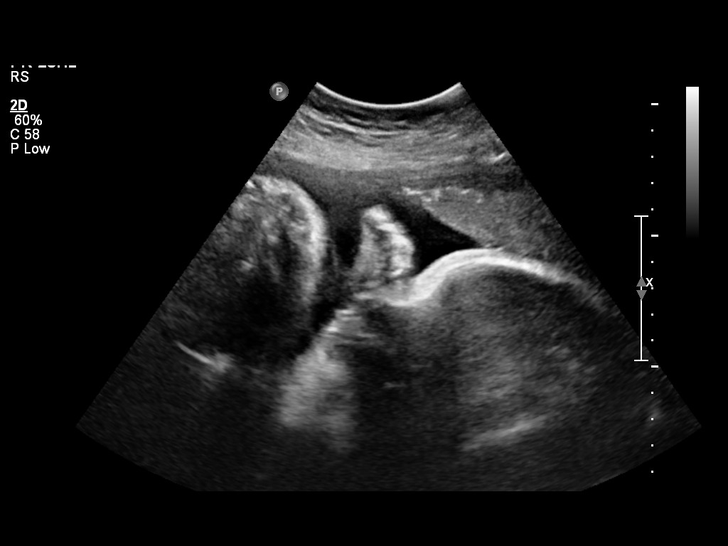
[im 10/37]
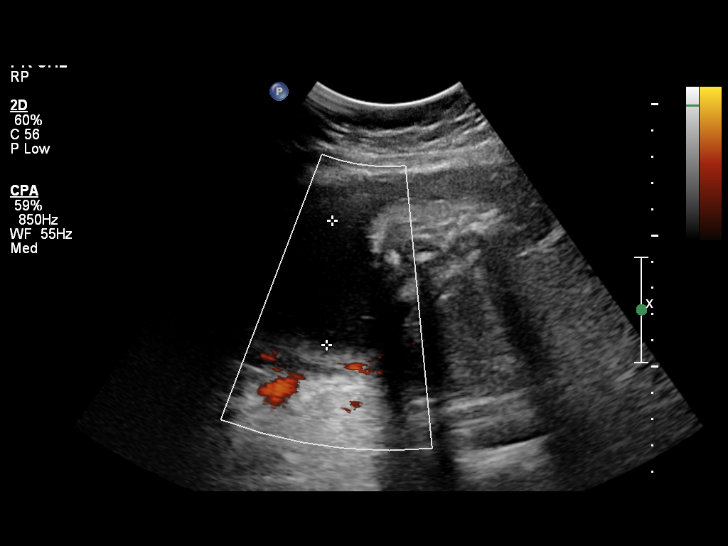
[im 13/37]
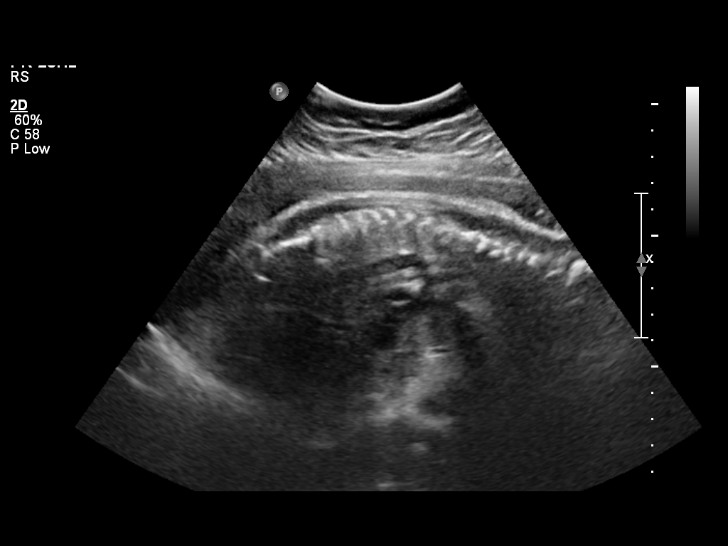
[im 15/37]
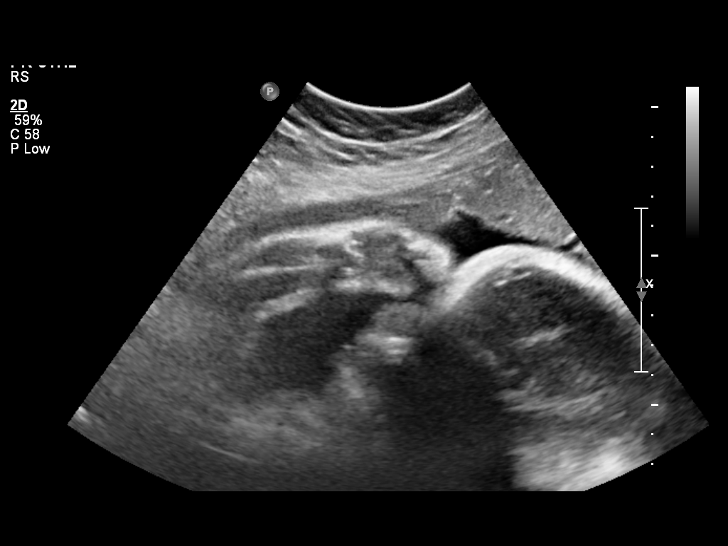
[im 18/37]
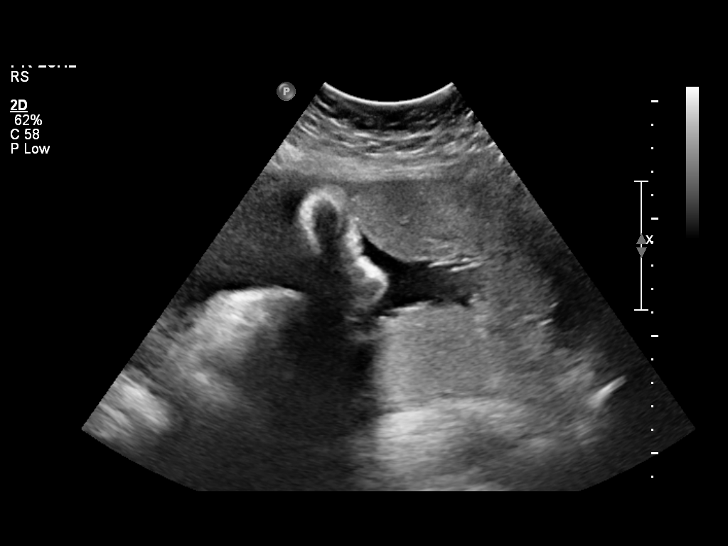
[im 21/37]
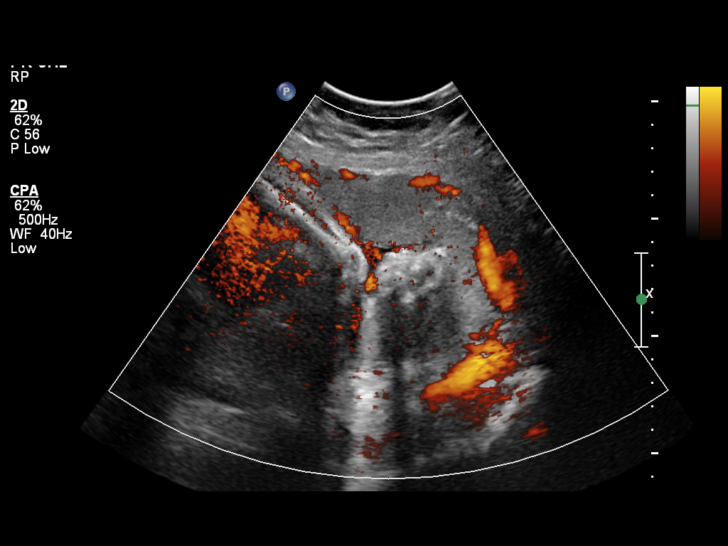
[im 23/37]
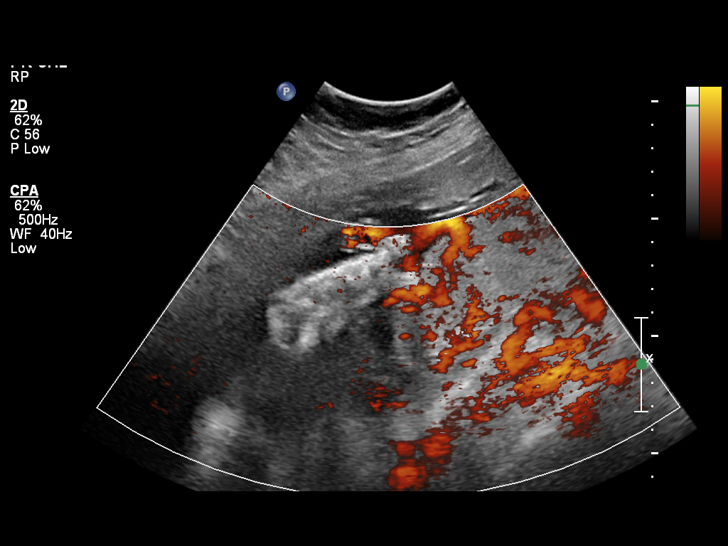
[im 26/37]
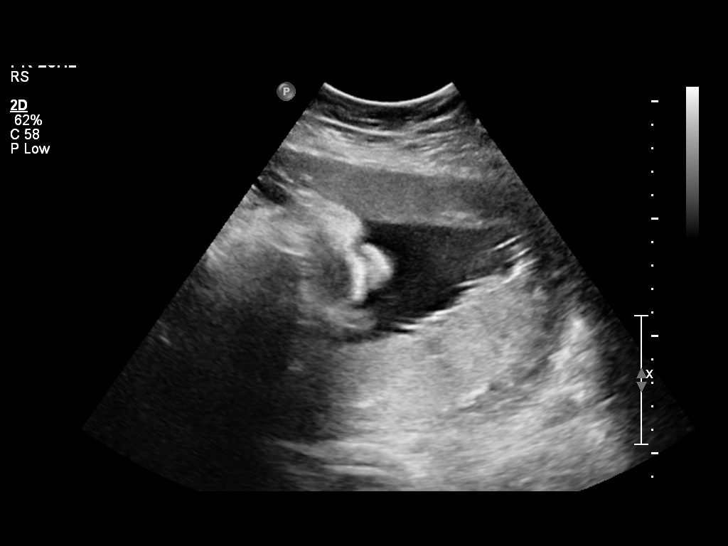
[im 29/37]
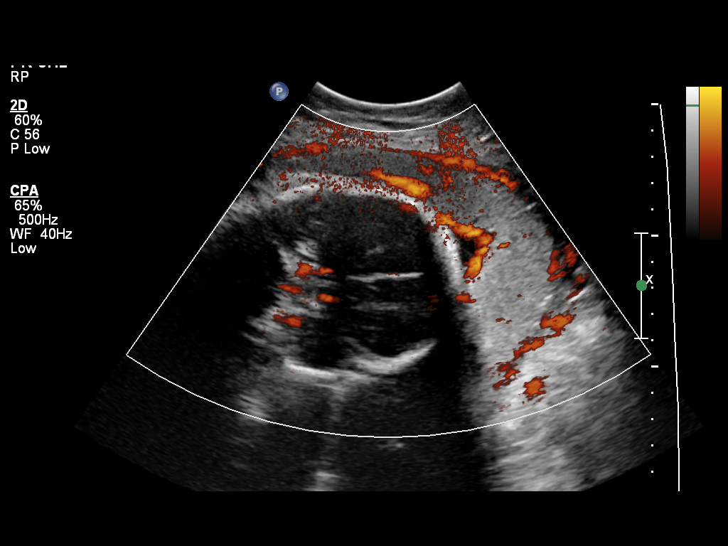
[im 31/37]
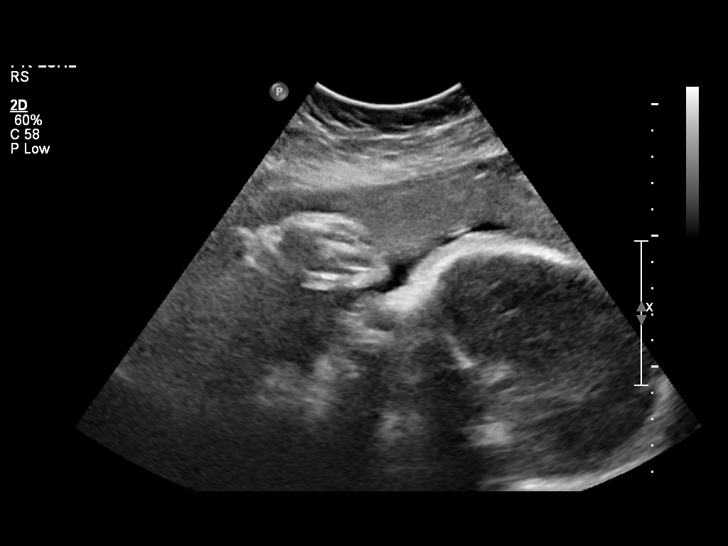
[im 34/37]
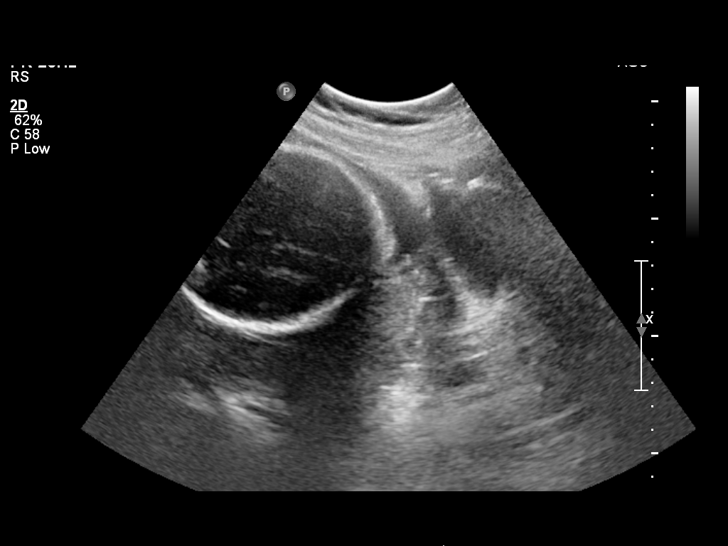
[im 37/37]
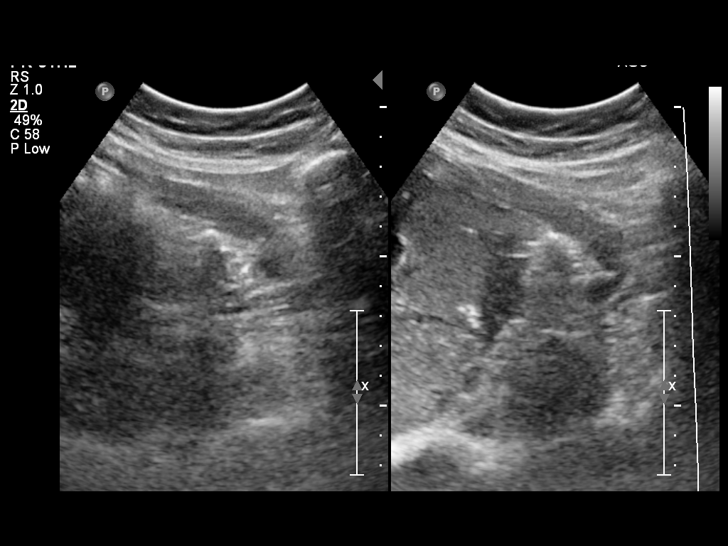

[14 of 28 positions shown; findings below may reference images not displayed]

LIMITED OBSTETRIC ULTRASOUND

Number of Fetuses: 1
Heart Rate: 150 bpm
Movement: Present
Breathing: Present
Presentation: Cephalic
Placental Location: Posterior
Previa: Complete
Amniotic Fluid (Subjective): Normal

Vertical Pocket 4.7 cm     AFI 12.99 cm  (5%ile = 7.9 cm; 95%ile =
24.9 cm)

BPD:  8.4 cm      34 w  0 d         EDC:  12/29/2012

MATERNAL FINDINGS:
Cervix: Not well visualized.
Uterus/Adnexae:  Normal right ovary.  Left ovary not well
visualized.

BIOPHYSICAL PROFILE

Movement: 2                   Time: 5 minutes
Breathing: 2
Tone: 2
Amniotic Fluid: 2

Total Score: 8
IMPRESSION: 1.  Placenta previa without evidence of abruption.
2.  Intrauterine pregnancy of 34 weeks 0 days, with normal amniotic
fluid index and a biophysical profile of [DATE].

Recommend followup with non-emergent complete OB 14+ wk US
examination for fetal biometric evaluation and anatomic survey if
not already performed.

## 2014-10-03 ENCOUNTER — Other Ambulatory Visit: Payer: Self-pay | Admitting: Internal Medicine

## 2014-10-03 DIAGNOSIS — E042 Nontoxic multinodular goiter: Secondary | ICD-10-CM

## 2014-10-28 ENCOUNTER — Encounter (HOSPITAL_COMMUNITY): Payer: Self-pay | Admitting: *Deleted

## 2014-11-11 ENCOUNTER — Ambulatory Visit
Admission: RE | Admit: 2014-11-11 | Discharge: 2014-11-11 | Disposition: A | Discharge status: Home / Self Care | Payer: BC Managed Care – PPO | Source: Ambulatory Visit | Attending: Internal Medicine | Admitting: Internal Medicine

## 2014-11-11 DIAGNOSIS — E042 Nontoxic multinodular goiter: Secondary | ICD-10-CM

## 2016-08-26 IMAGING — US US SOFT TISSUE HEAD/NECK
1 series · 13 of 25 positions shown · non-contrast
Comparison: 11/08/2013

CLINICAL DATA: 33-year-old female with a history of multinodular
goiter.

EXAM:
THYROID ULTRASOUND
TECHNIQUE: Ultrasound examination of the thyroid gland and adjacent soft
tissues was performed.

[Series 1: us soft tissue head/neck · 0.08mm/px · 13 of 50 slices shown]
[im 1/50]
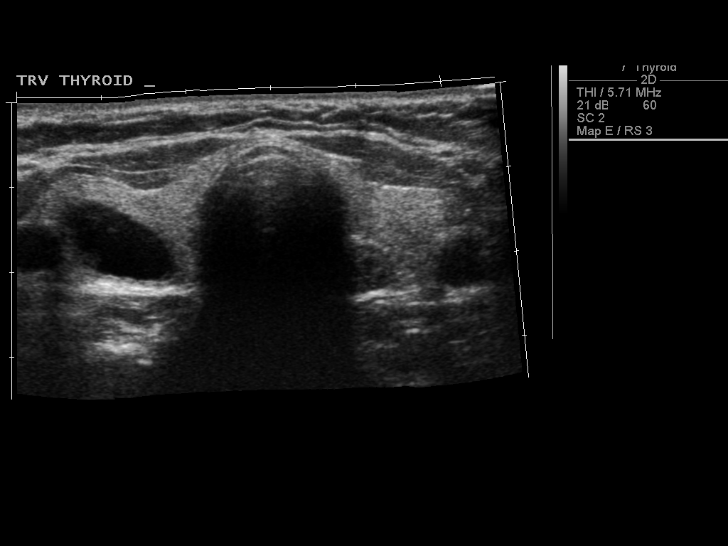
[im 5/50]
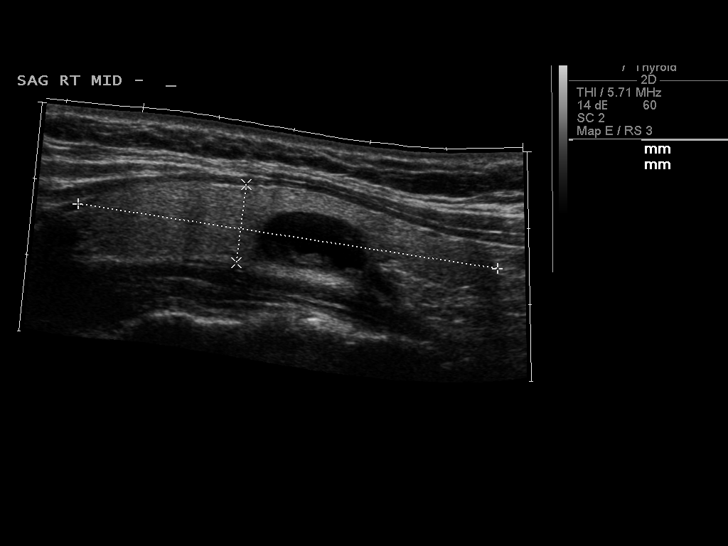
[im 9/50]
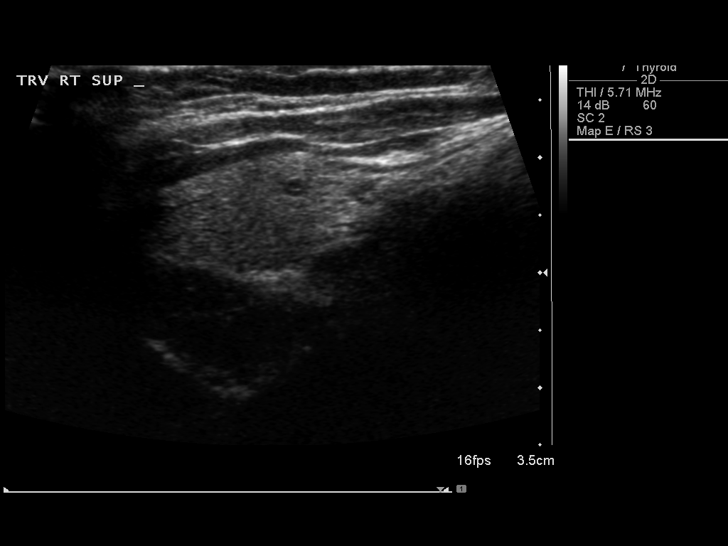
[im 13/50]
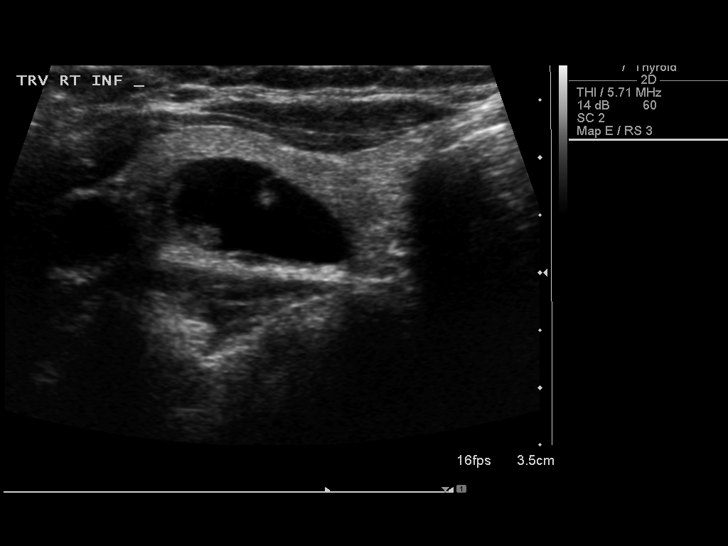
[im 17/50]
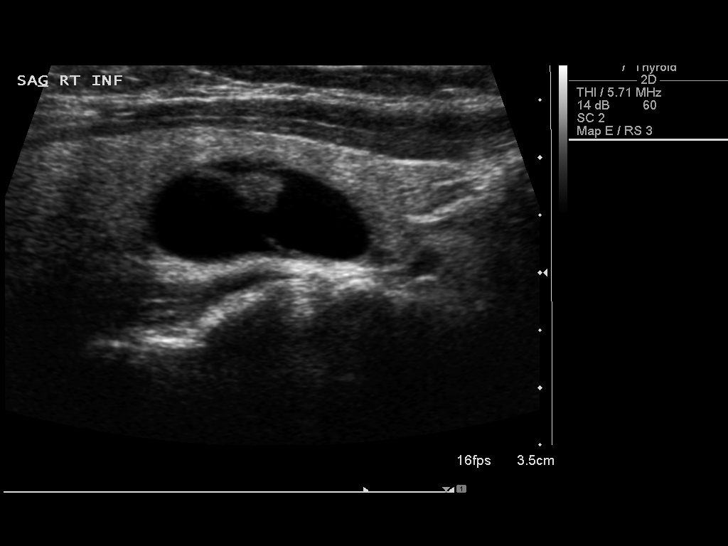
[im 21/50]
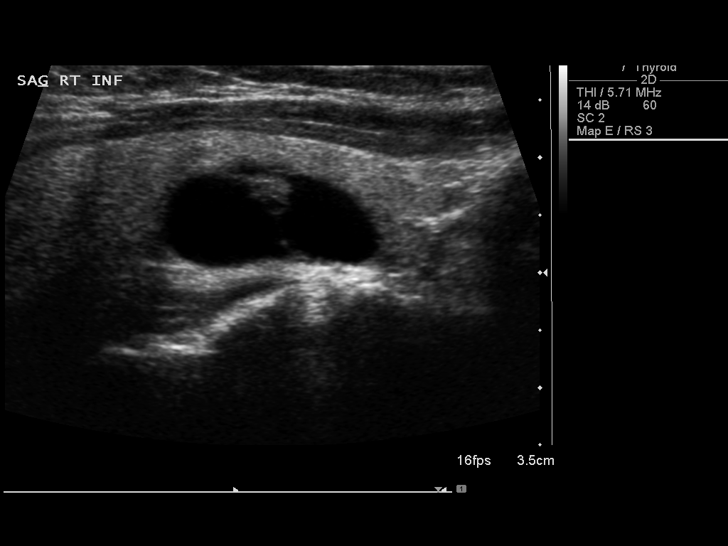
[im 25/50]
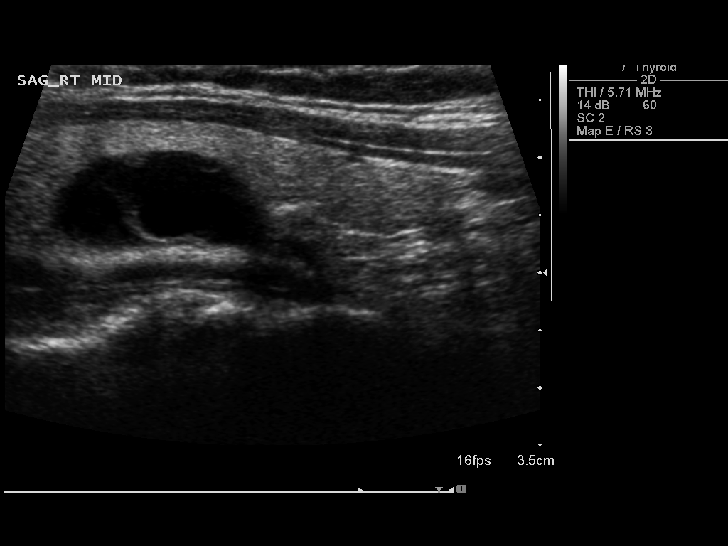
[im 29/50]
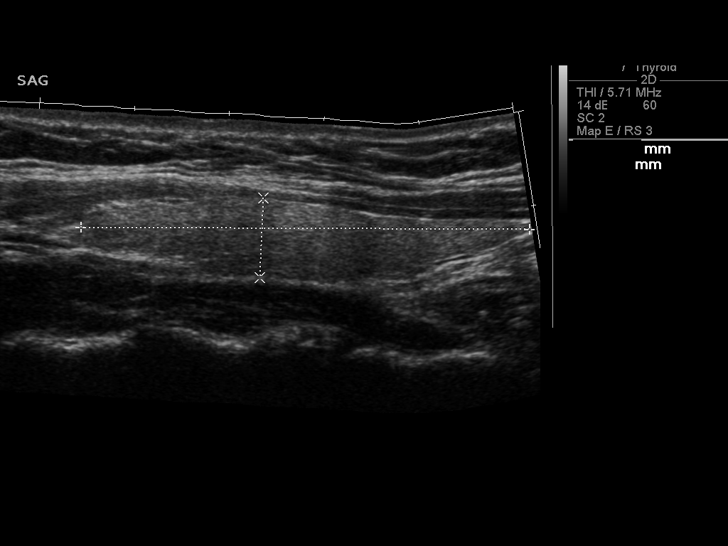
[im 33/50]
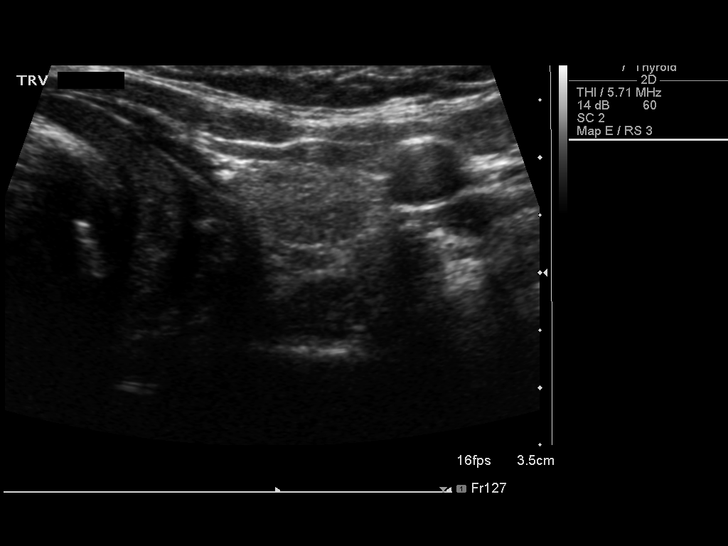
[im 37/50]
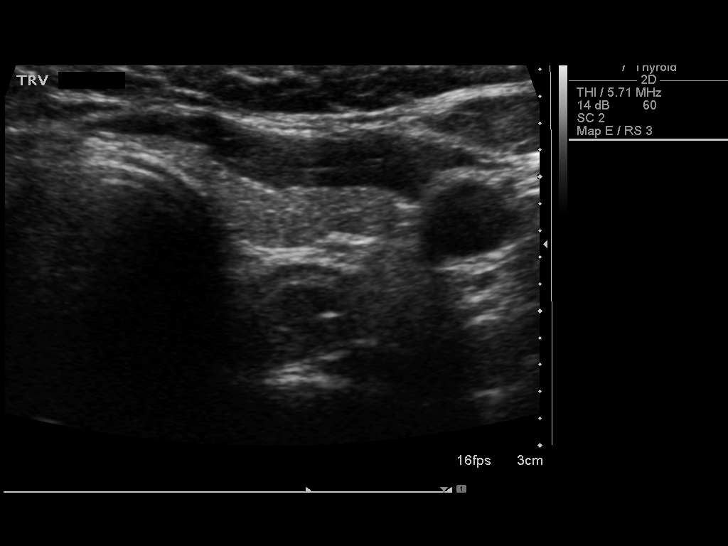
[im 41/50]
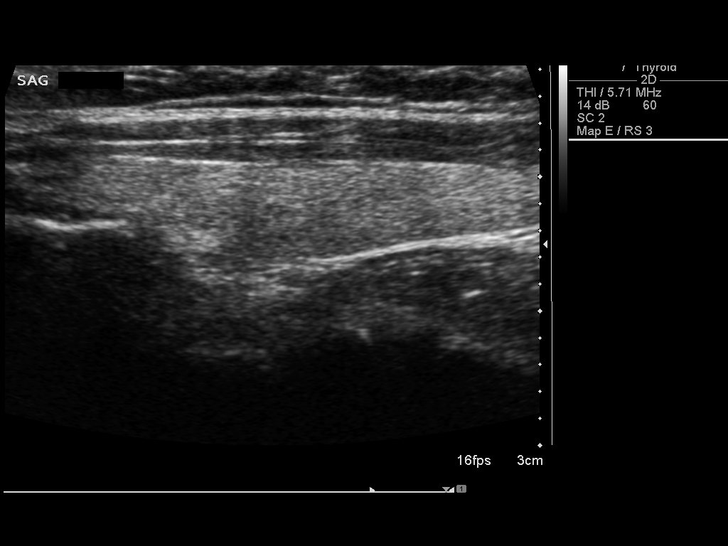
[im 45/50]
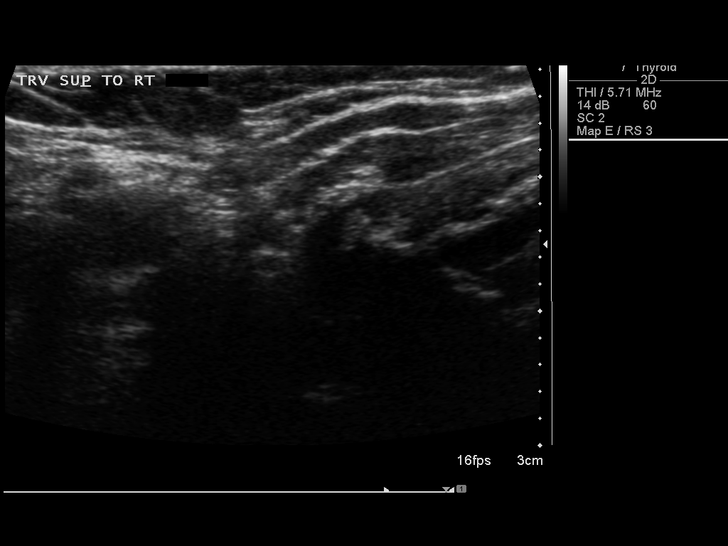
[im 50/50]
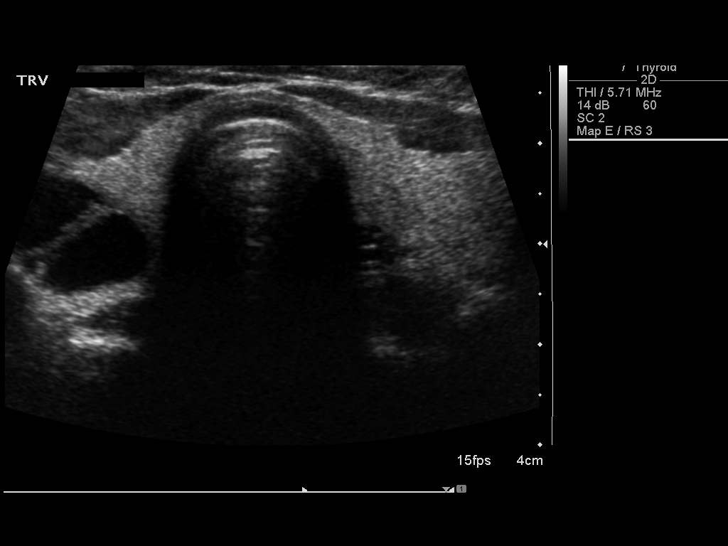

[13 of 25 positions shown; findings below may reference images not displayed]

FINDINGS: Right thyroid lobe

Measurements: 5.5 cm x 1.0 cm x 2.4 cm. Dominant nodule on the right
is predominantly cystic, with some complex the along the margin.
Lesion measures 1.7 cm by 1.0 cm x 1.9 cm. (Previously measures
cm x 1.3 cm x 1.5 cm).

Additional hypoechoic nodules on the right measures 2 mm -3 mm.

Left thyroid lobe

Measurements: 4.7 cm x 8 mm x 1.8 cm.  No nodules visualized.

Isthmus

Thickness: 2 mm-3 mm.  No nodules visualized.

Lymphadenopathy

None visualized.
IMPRESSION: Multinodular thyroid.

Dominant nodule of the right thyroid lobe is predominantly cystic,
measures as great as 1.9 cm, and is relatively unchanged in size
from the comparison. This nodule does not yet meet criteria for
biopsy.

Findings do not meet current SRU consensus criteria for biopsy.
Follow-up by clinical exam is recommended. If patient has known risk
factors for thyroid carcinoma, consider follow-up ultrasound in 12
months. If patient is clinically hyperthyroid, consider nuclear
medicine thyroid uptake and scan.Reference: Management of Thyroid
Nodules Detected at US: [employer] in Ultrasound
